# Patient Record
Sex: Female | Born: 1990 | Race: White | Hispanic: No | Marital: Married | Smoking: Former smoker
Health system: Southern US, Community
[De-identification: ages and names within clinical notes are randomized; demographics above are authoritative.]

## PROBLEM LIST (undated history)

## (undated) ENCOUNTER — Ambulatory Visit: Admission: EM | Payer: BC Managed Care – PPO | Source: Home / Self Care

## (undated) DIAGNOSIS — F419 Anxiety disorder, unspecified: Secondary | ICD-10-CM

---
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
## 2008-11-28 ENCOUNTER — Emergency Department (HOSPITAL_COMMUNITY): Admission: EM | Admit: 2008-11-28 | Discharge: 2008-11-28 | Payer: Self-pay | Admitting: Emergency Medicine

## 2009-12-03 ENCOUNTER — Emergency Department (HOSPITAL_COMMUNITY): Admission: EM | Admit: 2009-12-03 | Discharge: 2009-12-03 | Payer: Self-pay | Admitting: Emergency Medicine

## 2010-04-21 ENCOUNTER — Encounter: Payer: Self-pay | Admitting: Obstetrics & Gynecology

## 2010-08-13 NOTE — H&P (Signed)
Ellen Hodge, Ellen Hodge            ACCOUNT NO.:  [id number]   MEDICAL RECORD NO.:  [id number]          PATIENT TYPE:  INP   LOCATION:  9164                          FACILITY:  [facility]   PHYSICIAN:  Roseanna Rainbow, M.D.DATE OF BIRTH:  09/13/1990   DATE OF ADMISSION:  09/23/2007  DATE OF DISCHARGE:                              HISTORY & PHYSICAL   CHIEF COMPLAINT:  The patient is an 20 year old gravida 1, para 0 with  an estimated date of confinement of September 22, 2007, with an intrauterine  pregnancy at 40 plus weeks, complaining of rupture of membranes and  contractions.   HISTORY OF PRESENT ILLNESS:  Please see the above.   ALLERGIES:  No known drug allergies   MEDICATIONS:  Prenatal vitamins.   PRENATAL LABS:  Blood type is O+, antibody screen negative, Chlamydia  probe negative.  Urine culture and sensitivity, no growth.  GC probe  negative.  One-hour GCT 126.  Hepatitis B surface antigen negative.  Hematocrit 32.6 and hemoglobin 11.  HIV nonreactive.  Quad screen  negative.  Platelets 283,000.  RPR nonreactive.  Rubella immune.  GBS  negative on September 24, 2007.   PAST GYN HISTORY:  Noncontributory.   PAST MEDICAL HISTORY:  1. Tension headaches.  2. Asthma.   PAST SURGICAL HISTORY:  No previous surgery.   SOCIAL HISTORY:  She works at [employer].  She is single, living with a  significant other.  Does not give any significant history of alcohol  usage.  She has had no significant smoking history.  Denies illicit drug  use.   FAMILY HISTORY:  Alcoholism, Alzheimer's disease, arthritis, asthma,  melanoma, brain cancer, cervical cancer, breast cancer, head and neck  cancer, cataracts, adult-onset diabetes, heart disease, hypertension,  and hyperthyroidism.   PHYSICAL EXAMINATION:  On physical exam, vital signs are stable,  afebrile.  Fetal heart tracing reassuring.  Tocodynamometer, uterine  contractions every 7-8 minutes.  Sterile speculum exam per the RN and  nurse  practitioner, there is no pooling, only mucus.  Fern and Nitrazine  negative.  The cervix is 4 cm dilated, 90% effaced.   ASSESSMENT:  Primigravida at term, questionable spontaneous rupture of  membranes, late latent versus early active labor.  Fetal heart tracing  consistent with fetal well-being.   PLAN:  Admission, expectant management.      Roseanna Rainbow, M.D.  Electronically Signed     LAJ/MEDQ  D:  09/24/2007  T:  09/24/2007  Job:  [id number]

## 2010-12-26 LAB — CBC
HCT: 32.8 — ABNORMAL LOW
Hemoglobin: 11.2 — ABNORMAL LOW
MCHC: 34
MCHC: 34.4
MCV: 94.1
Platelets: 202
RBC: 2.17 — ABNORMAL LOW
RDW: 14.9

## 2012-09-27 ENCOUNTER — Emergency Department (HOSPITAL_COMMUNITY)
Admission: EM | Admit: 2012-09-27 | Discharge: 2012-09-27 | Discharge status: Against Medical Advice | Payer: Self-pay | Attending: Emergency Medicine | Admitting: Emergency Medicine

## 2012-09-27 ENCOUNTER — Emergency Department (HOSPITAL_COMMUNITY): Payer: Self-pay

## 2012-09-27 ENCOUNTER — Encounter (HOSPITAL_COMMUNITY): Payer: Self-pay | Admitting: Emergency Medicine

## 2012-09-27 DIAGNOSIS — F17200 Nicotine dependence, unspecified, uncomplicated: Secondary | ICD-10-CM | POA: Insufficient documentation

## 2012-09-27 DIAGNOSIS — R079 Chest pain, unspecified: Secondary | ICD-10-CM | POA: Insufficient documentation

## 2012-09-27 DIAGNOSIS — N63 Unspecified lump in unspecified breast: Secondary | ICD-10-CM | POA: Insufficient documentation

## 2012-09-27 NOTE — ED Notes (Signed)
Pt c/o chest tenderness to touch and ?mass in left breast area x 2 days

## 2012-09-27 NOTE — ED Notes (Signed)
Pt sts she can not wait longer and ambulated out of department

## 2012-09-28 ENCOUNTER — Emergency Department (HOSPITAL_BASED_OUTPATIENT_CLINIC_OR_DEPARTMENT_OTHER)
Admission: EM | Admit: 2012-09-28 | Discharge: 2012-09-28 | Disposition: A | Discharge status: Home / Self Care | Payer: Self-pay | Attending: Emergency Medicine | Admitting: Emergency Medicine

## 2012-09-28 ENCOUNTER — Encounter (HOSPITAL_BASED_OUTPATIENT_CLINIC_OR_DEPARTMENT_OTHER): Payer: Self-pay | Admitting: *Deleted

## 2012-09-28 DIAGNOSIS — R079 Chest pain, unspecified: Secondary | ICD-10-CM | POA: Insufficient documentation

## 2012-09-28 DIAGNOSIS — N63 Unspecified lump in unspecified breast: Secondary | ICD-10-CM | POA: Insufficient documentation

## 2012-09-28 DIAGNOSIS — F17200 Nicotine dependence, unspecified, uncomplicated: Secondary | ICD-10-CM | POA: Insufficient documentation

## 2012-09-28 DIAGNOSIS — N6320 Unspecified lump in the left breast, unspecified quadrant: Secondary | ICD-10-CM

## 2012-09-28 DIAGNOSIS — N644 Mastodynia: Secondary | ICD-10-CM | POA: Insufficient documentation

## 2012-09-28 NOTE — ED Provider Notes (Signed)
   History    CSN: [id number] Arrival date & time 09/28/12  1129  First MD Initiated Contact with Patient 09/28/12 1226     Chief Complaint  Patient presents with  . lump in left breast    (Consider location/radiation/quality/duration/timing/severity/associated sxs/prior Treatment) Patient is a 22 y.o. female presenting with chest pain. The history is provided by the patient. No language interpreter was used.  Chest Pain Chest pain location: left breast pain. Pain quality: aching   Pain radiates to the back: no   Pain severity:  Moderate Onset quality:  Gradual Timing:  Constant Relieved by:  Nothing Worsened by:  Nothing tried Anxiety: Pt reports area is sore to touch.   Pt complains of a lump in her left breast for the past 2 weeks.   History reviewed. No pertinent past medical history. History reviewed. No pertinent past surgical history. History reviewed. No pertinent family history. History  Substance Use Topics  . Smoking status: Current Every Day Smoker  . Smokeless tobacco: Not on file  . Alcohol Use: No   OB History   Grav Para Term Preterm Abortions TAB SAB Ect Mult Living                 Review of Systems  Cardiovascular: Positive for chest pain.  All other systems reviewed and are negative.    Allergies  Review of patient's allergies indicates no known allergies.  Home Medications  No current outpatient prescriptions on file. Pulse 72  Temp(Src) 98.4 F (36.9 C) (Oral)  Resp 18  Ht 5\' 5"  (1.651 m)  Wt 120 lb (54.432 kg)  BMI 19.97 kg/m2  SpO2 100%  LMP 08/29/2012 Physical Exam  Nursing note and vitals reviewed. Constitutional: She appears well-developed and well-nourished.  HENT:  Head: Normocephalic.  Cardiovascular: Normal rate.   Pulmonary/Chest: Effort normal.  Abdominal: Soft.  Musculoskeletal: Normal range of motion.  Tender left breast  1.5 cm round tender area,  12 oclock  Neurological: She is alert.  Skin: Skin is warm.    ED  Course  Procedures (including critical care time) Labs Reviewed - No data to display Dg Chest 2 View  09/27/2012   *RADIOLOGY REPORT*  Clinical Data: 22 year old female with intermittent dizziness. Left chest/breast pain.  CHEST - 2 VIEW  Comparison: None.  Findings: Normal lung volumes. Normal cardiac size and mediastinal contours.  Visualized tracheal air column is within normal limits. The lungs are clear.  No pneumothorax or effusion.  Mild scoliosis. No acute osseous abnormality identified.   Incidental bilateral anterior rib costochondral calcification.  IMPRESSION: Negative, no acute cardiopulmonary abnormality.   Original Report Authenticated By: Erskine Speed, M.D.   1. Breast mass, left     MDM  Pt scheduled at [facility] for a ultrasound of left breast  Elson Areas, PA-C 09/28/12 1311

## 2012-09-28 NOTE — ED Notes (Signed)
Breasts have been tender x 2-3 weeks several days ago noticed a lump in left breast

## 2012-09-29 ENCOUNTER — Other Ambulatory Visit: Payer: Self-pay | Admitting: Emergency Medicine

## 2012-09-29 ENCOUNTER — Ambulatory Visit
Admission: RE | Admit: 2012-09-29 | Discharge: 2012-09-29 | Disposition: A | Discharge status: Home / Self Care | Payer: No Typology Code available for payment source | Source: Ambulatory Visit | Attending: Emergency Medicine | Admitting: Emergency Medicine

## 2012-09-29 ENCOUNTER — Inpatient Hospital Stay: Admit: 2012-09-29 | Payer: No Typology Code available for payment source

## 2012-09-29 DIAGNOSIS — N63 Unspecified lump in unspecified breast: Secondary | ICD-10-CM

## 2012-09-29 NOTE — ED Provider Notes (Signed)
History/physical exam/procedure(s) were performed by non-physician practitioner and as supervising physician I was immediately available for consultation/collaboration. I have reviewed all notes and am in agreement with care and plan.   Hilario Quarry, MD 09/29/12 2123

## 2012-10-04 ENCOUNTER — Other Ambulatory Visit: Payer: No Typology Code available for payment source

## 2014-09-03 ENCOUNTER — Other Ambulatory Visit: Payer: Self-pay | Admitting: Pain Medicine

## 2015-10-16 ENCOUNTER — Encounter (HOSPITAL_COMMUNITY): Payer: Self-pay | Admitting: Emergency Medicine

## 2015-10-16 ENCOUNTER — Emergency Department (HOSPITAL_COMMUNITY)
Admission: EM | Admit: 2015-10-16 | Discharge: 2015-10-16 | Disposition: A | Discharge status: Home / Self Care | Payer: BLUE CROSS/BLUE SHIELD | Attending: Emergency Medicine | Admitting: Emergency Medicine

## 2015-10-16 ENCOUNTER — Emergency Department (HOSPITAL_COMMUNITY): Payer: BLUE CROSS/BLUE SHIELD

## 2015-10-16 DIAGNOSIS — Z7982 Long term (current) use of aspirin: Secondary | ICD-10-CM | POA: Diagnosis not present

## 2015-10-16 DIAGNOSIS — Z79899 Other long term (current) drug therapy: Secondary | ICD-10-CM | POA: Insufficient documentation

## 2015-10-16 DIAGNOSIS — R0789 Other chest pain: Secondary | ICD-10-CM | POA: Diagnosis present

## 2015-10-16 DIAGNOSIS — F17200 Nicotine dependence, unspecified, uncomplicated: Secondary | ICD-10-CM | POA: Insufficient documentation

## 2015-10-16 DIAGNOSIS — R0602 Shortness of breath: Secondary | ICD-10-CM | POA: Insufficient documentation

## 2015-10-16 HISTORY — DX: Anxiety disorder, unspecified: F41.9

## 2015-10-16 LAB — CBC WITH DIFFERENTIAL/PLATELET
BASOS ABS: 0 10*3/uL (ref 0.0–0.1)
BASOS PCT: 0 %
Eosinophils Absolute: 0.1 10*3/uL (ref 0.0–0.7)
Eosinophils Relative: 1 %
HEMATOCRIT: 36.3 % (ref 36.0–46.0)
Hemoglobin: 11.6 g/dL — ABNORMAL LOW (ref 12.0–15.0)
LYMPHS PCT: 15 %
Lymphs Abs: 1.6 10*3/uL (ref 0.7–4.0)
MCH: 27.9 pg (ref 26.0–34.0)
MCHC: 32 g/dL (ref 30.0–36.0)
MCV: 87.3 fL (ref 78.0–100.0)
MONO ABS: 0.9 10*3/uL (ref 0.1–1.0)
Monocytes Relative: 8 %
NEUTROS ABS: 8.1 10*3/uL — AB (ref 1.7–7.7)
Neutrophils Relative %: 76 %
PLATELETS: 401 10*3/uL — AB (ref 150–400)
RBC: 4.16 MIL/uL (ref 3.87–5.11)
RDW: 13.8 % (ref 11.5–15.5)
WBC: 10.7 10*3/uL — AB (ref 4.0–10.5)

## 2015-10-16 LAB — BASIC METABOLIC PANEL
ANION GAP: 6 (ref 5–15)
BUN: 10 mg/dL (ref 6–20)
CALCIUM: 9.6 mg/dL (ref 8.9–10.3)
CO2: 27 mmol/L (ref 22–32)
Chloride: 106 mmol/L (ref 101–111)
Creatinine, Ser: 0.8 mg/dL (ref 0.44–1.00)
GLUCOSE: 95 mg/dL (ref 65–99)
POTASSIUM: 3.7 mmol/L (ref 3.5–5.1)
Sodium: 139 mmol/L (ref 135–145)

## 2015-10-16 LAB — I-STAT TROPONIN, ED: Troponin i, poc: 0 ng/mL (ref 0.00–0.08)

## 2015-10-16 LAB — I-STAT BETA HCG BLOOD, ED (MC, WL, AP ONLY)

## 2015-10-16 LAB — BRAIN NATRIURETIC PEPTIDE: B NATRIURETIC PEPTIDE 5: 22.8 pg/mL (ref 0.0–100.0)

## 2015-10-16 MED ORDER — LORAZEPAM 1 MG PO TABS
1.0000 mg | ORAL_TABLET | Freq: Once | ORAL | Status: AC
  Administered 2015-10-16: 1 mg via ORAL
  Filled 2015-10-16: qty 1

## 2015-10-16 NOTE — ED Provider Notes (Signed)
CSN: [id number]     Arrival date & time 10/16/15  1524 History   First MD Initiated Contact with Patient 10/16/15 1631     Chief Complaint  Patient presents with  . Chest Pain  . Anxiety     (Consider location/radiation/quality/duration/timing/severity/associated sxs/prior Treatment) HPI [age] year old female who presents with intermittent chest pressure and tightness for past week or so. History of Anxiety. States intermittent chest pressure and tightness, and has had 2 episodes that woken her up from sleep. Not associated with exertion. Unable to predict onset and not sure what aggravates it, incites, or alleviates it. Has not taken any medications. When she has symptoms, associated with dyspnea. States symptoms have been increasing her stress. She has had increased stress at home. No fever, chills, cough or other URI symptoms. No LE edema or calf tenderness. No immobilizations, family history of DVT/PE or personal history. No birth control. No orthopnea or PND.   Today at the gas station, when she started feeling tingling all over, lightheaded with chest pressure. No syncope, but became very anxious about it. States that her husbands side of family with early heart problems that made her very concerned.  Past Medical History  Diagnosis Date  . Anxiety    History reviewed. No pertinent past surgical history. History reviewed. No pertinent family history. Social History  Substance Use Topics  . Smoking status: Current Every Day Smoker  . Smokeless tobacco: None  . Alcohol Use: No   OB History    No data available     Review of Systems 10/14 systems reviewed and are negative other than those stated in the HPI    Allergies  Review of patient's allergies indicates no known allergies.  Home Medications   Prior to Admission medications   Medication Sig Start Date End Date Taking? Authorizing Provider  aspirin EC 325 MG tablet Take 650 mg by mouth 2 (two) times daily as needed for  mild pain.   Yes Historical Provider, MD  Multiple Vitamins-Minerals (HAIR SKIN AND NAILS FORMULA PO) Take 2 tablets by mouth daily.   Yes Historical Provider, MD   BP 119/76 mmHg  Pulse 83  Temp(Src) 97.7 F (36.5 C) (Oral)  Resp 14  SpO2 100%  LMP 10/02/2015 (Approximate) Physical Exam Physical Exam  Nursing note and vitals reviewed. Constitutional: Well developed, well nourished, anxious appearing and intermittently tearful, but non-toxic, and in no acute distress Head: Normocephalic and atraumatic.  Mouth/Throat: Oropharynx is clear and moist.  Neck: Normal range of motion. Neck supple.  Cardiovascular: Normal rate and regular rhythm.   Pulmonary/Chest: Effort normal and breath sounds normal.  Abdominal: Soft. There is no tenderness. There is no rebound and no guarding.  Musculoskeletal: Normal range of motion.  Neurological: Alert, no facial droop, fluent speech, moves all extremities symmetrically Skin: Skin is warm and dry.  Psychiatric: Cooperative  ED Course  Procedures (including critical care time) Labs Review Labs Reviewed  CBC WITH DIFFERENTIAL/PLATELET - Abnormal; Notable for the following:    WBC 10.7 (*)    Hemoglobin 11.6 (*)    Platelets 401 (*)    Neutro Abs 8.1 (*)    All other components within normal limits  BASIC METABOLIC PANEL  BRAIN NATRIURETIC PEPTIDE  I-STAT TROPOININ, ED  I-STAT BETA HCG BLOOD, ED ([facility], [facility], [facility] ONLY)    Imaging Review Dg Chest 2 View  10/16/2015  CLINICAL DATA:  Shortness of breath and right-sided chest pressure sensation EXAM: CHEST  2 VIEW COMPARISON:  September 27, 2012 FINDINGS: Lungs are clear. Heart size and pulmonary vascularity are normal. No adenopathy. There is lower thoracic levoscoliosis. No pneumothorax. IMPRESSION: No edema or consolidation. Electronically Signed   By: Bretta Bang III M.D.   On: 10/16/2015 17:49   I have personally reviewed and evaluated these images and lab results as part of my medical  decision-making.   EKG Interpretation   Date/Time:  Tuesday October 16 2015 15:36:09 EDT Ventricular Rate:  92 PR Interval:    QRS Duration: 91 QT Interval:  344 QTC Calculation: 426 R Axis:   83 Text Interpretation:  Sinus rhythm Consider left atrial enlargement RSR'  in V1 or V2, probably normal variant Nonspecific repol abnormality,  diffuse leads No acute changes Confirmed by Amadeo Coke MD, Annabelle Harman ([id number]) on  10/16/2015 4:33:19 PM      MDM   Final diagnoses:  Chest pressure   25 year old female who presents with intermittent chest pressure, dyspnea, and lightheadedness. Well appearing but very anxious on presentation. Vital signs within normal limits. EKG unchanged from prior and overall non-concerning. CXR normal. BNP normal. PERC negative. Not suspcious for ACS given lack of risk factors and pain atypical. PERC negative and no risk factors for PE. Not pregnant. Low suspicious for life threatening intrathoracic or cardiopulmonary processes. Discussed supportive management, and establishing PCP for ongoing care. Strict return and follow-up instructions reviewed. She expressed understanding of all discharge instructions and felt comfortable with the plan of care.      Lavera Guise, MD 10/16/15 [phone]

## 2015-10-16 NOTE — Discharge Instructions (Signed)
We did not find serious cause of your chest pain today. Return without fail for worsening symptoms, including passing out, worsening pain or difficulty breathing, or any other symptoms concerning to you.  Nonspecific Chest Pain It is often hard to find the cause of chest pain. There is always a chance that your pain could be related to something serious, such as a heart attack or a blood clot in your lungs. Chest pain can also be caused by conditions that are not life-threatening. If you have chest pain, it is very important to follow up with your doctor.  HOME CARE  If you were prescribed an antibiotic medicine, finish it all even if you start to feel better.  Avoid any activities that cause chest pain.  Do not use any tobacco products, including cigarettes, chewing tobacco, or electronic cigarettes. If you need help quitting, ask your doctor.  Do not drink alcohol.  Take medicines only as told by your doctor.  Keep all follow-up visits as told by your doctor. This is important. This includes any further testing if your chest pain does not go away.  Your doctor may tell you to keep your head raised (elevated) while you sleep.  Make lifestyle changes as told by your doctor. These may include:  Getting regular exercise. Ask your doctor to suggest some activities that are safe for you.  Eating a heart-healthy diet. Your doctor or a diet specialist (dietitian) can help you to learn healthy eating options.  Maintaining a healthy weight.  Managing diabetes, if necessary.  Reducing stress. GET HELP IF:  Your chest pain does not go away, even after treatment.  You have a rash with blisters on your chest.  You have a fever. GET HELP RIGHT AWAY IF:  Your chest pain is worse.  You have an increasing cough, or you cough up blood.  You have severe belly (abdominal) pain.  You feel extremely weak.  You pass out (faint).  You have chills.  You have sudden, unexplained chest  discomfort.  You have sudden, unexplained discomfort in your arms, back, neck, or jaw.  You have shortness of breath at any time.  You suddenly start to sweat, or your skin gets clammy.  You feel nauseous.  You vomit.  You suddenly feel light-headed or dizzy.  Your heart begins to beat quickly, or it feels like it is skipping beats. These symptoms may be an emergency. Do not wait to see if the symptoms will go away. Get medical help right away. Call your local emergency services (911 in the [place] Do not drive yourself to the hospital.   This information is not intended to replace advice given to you by your health care provider. Make sure you discuss any questions you have with your health care provider.   Document Released: 09/03/2007 Document Revised: 04/07/2014 Document Reviewed: 10/21/2013 Elsevier Interactive Patient Education [employer]2016 Elsevier Inc.

## 2015-10-16 NOTE — ED Notes (Addendum)
Pt reports mid chest pressure/tightness for the past week. Today symptoms got worse and were accompanied by feeling hot and extremity tingling. Hx of anxiety, but does not take anything for it.

## 2017-02-11 ENCOUNTER — Ambulatory Visit: Payer: BLUE CROSS/BLUE SHIELD | Admitting: Obstetrics and Gynecology

## 2017-03-31 HISTORY — PX: BREAST ENHANCEMENT SURGERY: SHX7

## 2018-12-02 ENCOUNTER — Other Ambulatory Visit: Payer: Self-pay

## 2018-12-02 ENCOUNTER — Ambulatory Visit
Admission: EM | Admit: 2018-12-02 | Discharge: 2018-12-02 | Disposition: A | Discharge status: Home / Self Care | Payer: BC Managed Care – PPO | Attending: Emergency Medicine | Admitting: Emergency Medicine

## 2018-12-02 ENCOUNTER — Encounter: Payer: Self-pay | Admitting: Emergency Medicine

## 2018-12-02 DIAGNOSIS — L03115 Cellulitis of right lower limb: Secondary | ICD-10-CM | POA: Diagnosis not present

## 2018-12-02 DIAGNOSIS — W57XXXA Bitten or stung by nonvenomous insect and other nonvenomous arthropods, initial encounter: Secondary | ICD-10-CM

## 2018-12-02 MED ORDER — DOXYCYCLINE HYCLATE 100 MG PO CAPS: 100.0000 mg | ORAL_CAPSULE | Freq: Two times a day (BID) | ORAL | 0 refills | Status: AC

## 2018-12-02 NOTE — ED Provider Notes (Signed)
[facility]    CSN: [id number] Arrival date [id number]& time: 12/02/18  [id number]      History   Chief Complaint Chief Complaint  Patient presents with  . Cellulitis    HPI Camille A Ave FilterChandler is a 28 y.o. female presenting for rash on her right inferolateral glute.  States that she was bitten by a bug/possibly spider 2 days ago: Was unable to see/identify bug.  Patient states that she marked circle around the raised area of erythema/warmth this morning: No significant growth since then.  Patient denies arthralgias, myalgias, fever, malaise.  Has not used anything for her symptoms.   Past Medical History:  Diagnosis Date  . Anxiety     There are no active problems to display for this patient.   History reviewed. No pertinent surgical history.  OB History   No obstetric history on file.      Home Medications    Prior to Admission medications   Medication Sig Start Date End Date Taking? Authorizing Provider  aspirin EC 325 MG tablet Take 650 mg by mouth 2 (two) times daily as needed for mild pain.    [provider]  doxycycline (VIBRAMYCIN) 100 MG capsule Take 1 capsule (100 mg total) by mouth 2 (two) times daily for 7 days. 12/02/18 12/09/18  Hall-Potvin, [place], PA-C  Multiple Vitamins-Minerals (HAIR SKIN AND NAILS FORMULA PO) Take 2 tablets by mouth daily.    [provider]    Family History History reviewed. No pertinent family history.  Social History Social History   Tobacco Use  . Smoking status: Former Smoker    Quit date: 2015    Years since quitting: 5.6  . Smokeless tobacco: Never Used  Substance Use Topics  . Alcohol use: No  . Drug use: No     Allergies   Patient has no known allergies.   Review of Systems Review of Systems  Constitutional: Negative for fatigue and fever.  HENT: Negative for ear pain, sinus pain, sore throat and voice change.   Eyes: Negative for pain, redness and visual disturbance.  Respiratory:  Negative for cough and shortness of breath.   Cardiovascular: Negative for chest pain and palpitations.  Gastrointestinal: Negative for abdominal pain, diarrhea and vomiting.  Musculoskeletal: Negative for arthralgias and myalgias.  Skin: Positive for rash. Negative for wound.  Neurological: Negative for syncope and headaches.     Physical Exam Triage Vital Signs ED Triage Vitals  Enc Vitals Group     BP      Pulse      Resp      Temp      Temp src      SpO2      Weight      Height      Head Circumference      Peak Flow      Pain Score      Pain Loc      Pain Edu?      Excl. in GC?    No data found.  Updated Vital Signs BP 123/80 (BP Location: Right Arm)   Pulse 81   Temp 97.8 F (36.6 C) (Oral)   Resp 18   LMP 11/24/2018   SpO2 98%    Physical Exam Constitutional:      General: She is not in acute distress. HENT:     Head: Normocephalic and atraumatic.  Eyes:     General: No scleral icterus.    Pupils: Pupils are equal, round,  and reactive to light.  Cardiovascular:     Rate and Rhythm: Normal rate.  Pulmonary:     Effort: Pulmonary effort is normal.  Skin:    Coloration: Skin is not jaundiced or pale.     Comments: 8 cm circumferential raised area of erythema that is hot and tender to patient.  No streaking, discharge.  Lesion appears to be indurated without fluctuance.  Neurological:     Mental Status: She is alert and oriented to person, place, and time.      UC Treatments / Results  Labs (all labs ordered are listed, but only abnormal results are displayed) Labs Reviewed - No data to display  EKG   Radiology No results found.  Procedures Procedures (including critical care time)  Medications Ordered in UC Medications - No data to display  Initial Impression / Assessment and Plan / UC Course  I have reviewed the triage vital signs and the nursing notes.  Pertinent labs & imaging results that were available during my care of the  patient were reviewed by me and considered in my medical decision making (see chart for details).     1.  Bug bite Unknown species.  Patient will continue to monitor for systemic symptoms including fever, arthralgia, myalgia, chest pain, shortness of breath, streaking.  2.  Cellulitis Area is tender, indurated, hot to touch.  Will initiate doxycycline as lesion is highly suspicious for infection.  Return precautions discussed, patient verbalized understanding and is agreeable to plan. Final Clinical Impressions(s) / UC Diagnoses   Final diagnoses:  Cellulitis of right lower extremity  Bug bite, initial encounter     Discharge Instructions     Take antibiotic as prescribed. Return for worsening redness, pain, discharge, fever.    ED Prescriptions    Medication Sig Dispense Auth. Provider   doxycycline (VIBRAMYCIN) 100 MG capsule Take 1 capsule (100 mg total) by mouth 2 (two) times daily for 7 days. 14 capsule Hall-Potvin, [place], PA-C     Controlled Substance Prescriptions [place] Controlled Substance Registry consulted? Not Applicable   Quincy Sheehan, [place] 12/02/18 9518

## 2018-12-02 NOTE — Discharge Instructions (Addendum)
Take antibiotic as prescribed. Return for worsening redness, pain, discharge, fever.

## 2018-12-02 NOTE — ED Notes (Signed)
Patient able to ambulate independently  

## 2018-12-02 NOTE — ED Triage Notes (Signed)
Pt presents to [facility] for assessment of possible bug bite to right posterior thigh with redness and cellulitis spreading from area.  Patient marked around the redness this morning and redness has spread slightly outside her marks at this time.

## 2018-12-03 ENCOUNTER — Telehealth: Payer: Self-pay | Admitting: Emergency Medicine

## 2018-12-03 NOTE — Telephone Encounter (Signed)
Checked in on patient, allowed her to ask any questions she wanted, and encouraged return call if she has any issues.

## 2019-10-19 ENCOUNTER — Ambulatory Visit
Admission: EM | Admit: 2019-10-19 | Discharge: 2019-10-19 | Disposition: A | Discharge status: Home / Self Care | Payer: BC Managed Care – PPO | Attending: Family Medicine | Admitting: Family Medicine

## 2019-10-19 ENCOUNTER — Ambulatory Visit (INDEPENDENT_AMBULATORY_CARE_PROVIDER_SITE_OTHER): Payer: BC Managed Care – PPO

## 2019-10-19 DIAGNOSIS — M25472 Effusion, left ankle: Secondary | ICD-10-CM

## 2019-10-19 DIAGNOSIS — S93402A Sprain of unspecified ligament of left ankle, initial encounter: Secondary | ICD-10-CM | POA: Diagnosis not present

## 2019-10-19 DIAGNOSIS — W1830XA Fall on same level, unspecified, initial encounter: Secondary | ICD-10-CM

## 2019-10-19 DIAGNOSIS — M25572 Pain in left ankle and joints of left foot: Secondary | ICD-10-CM | POA: Diagnosis not present

## 2019-10-19 MED ORDER — TRAMADOL HCL 50 MG PO TABS: 50.0000 mg | ORAL_TABLET | Freq: Four times a day (QID) | ORAL | 0 refills | Status: DC | PRN

## 2019-10-19 MED ORDER — CYCLOBENZAPRINE HCL 10 MG PO TABS: 10.0000 mg | ORAL_TABLET | Freq: Two times a day (BID) | ORAL | 0 refills | Status: DC | PRN

## 2019-10-19 NOTE — ED Triage Notes (Signed)
Pt states stepped in a hole in her yard this am, rolled lt ankle and fell to the ground. States iced and elevated this am. Swelling and bruising noted to lt ankle.

## 2019-10-19 NOTE — Discharge Instructions (Addendum)
Take the tramadol as prescribed.  Rest and elevate your hand.  Apply ice packs 2-3 times a day for up to 20 minutes each.   I have sent in cyclobenzaprine for you to take twice a day as needed for muscle spasm  Wear the wrap when you are up and active. Wear boot with the wrap after about 2-3 days of staying off of the ankle  If you are not improving over the next 2 weeks, follow up with orthopedics    Follow up with your primary care provider or an orthopedist if you symptoms continue or worsen;  Or if you develop new symptoms, such as numbness, tingling, or weakness.

## 2019-10-19 NOTE — ED Provider Notes (Signed)
[facility]   [id number] 10/19/19 Arrival Time: 1442  TD:DUKGU PAIN  SUBJECTIVE: History from: patient   [employer]   [id number] 10/19/19 Arrival Time: 1442  SE:GBTDV PAIN  SUBJECTIVE: History from: patient. Ellen Hodge is a 29 y.o. female complains of left ankle pain that began about 10 AM this morning.  Reports that she got outside in a hole on the ground that she did not see.  Reports that her ankle bilaterally.  Reports that he has a very swollen, very painful.  Reports she cannot bear weight on it.  Has taken ibuprofen with no relief.  Has used ice with some relief.  Is walking with crutches.  Localizes the pain and swelling to lateral left ankle.  Symptoms are made worse with activity, mildly alleviated by rest. Denies fever, chills, erythema, ecchymosis, weakness, numbness and tingling, saddle paresthesias, loss of bowel or bladder function.      ROS: As per HPI.  All other pertinent ROS negative.     Past Medical History:  Diagnosis Date  . Anxiety    History reviewed. No pertinent surgical history. No Known Allergies No current facility-administered medications on file prior to encounter.   Current Outpatient Medications on File Prior to Encounter  Medication Sig Dispense Refill  . aspirin EC 325 MG tablet Take 650 mg by mouth 2 (two) times daily as needed for mild pain.    . Multiple Vitamins-Minerals (HAIR SKIN AND NAILS FORMULA PO) Take 2 tablets by mouth daily.     Social History   Socioeconomic History  . Marital status: Single    Spouse name: Not on file  . Number of children: Not on file  . Years of education: Not on file  . Highest education level: Not on file  Occupational History  . Not on file  Tobacco Use  . Smoking status: Former Smoker    Quit date: 2015    Years since quitting: 6.5  . Smokeless tobacco: Never Used  Substance and Sexual Activity  . Alcohol use: No  . Drug use: No  . Sexual activity: Not on file    Other Topics Concern  . Not on file  Social History Narrative  . Not on file   Social Determinants of Health   Financial Resource Strain:   . Difficulty of Paying Living Expenses:   Food Insecurity:   . Worried About Programme researcher, broadcasting/film/video in the Last Year:   . Barista in the Last Year:   Transportation Needs:   . Freight forwarder (Medical):   Marland Kitchen Lack of Transportation (Non-Medical):   Physical Activity:   . Days of Exercise per Week:   . Minutes of Exercise per Session:   Stress:   . Feeling of Stress :   Social Connections:   . Frequency of Communication with Friends and Family:   . Frequency of Social Gatherings with Friends and Family:   . Attends Religious Services:   . Active Member of Clubs or Organizations:   . Attends Banker Meetings:   Marland Kitchen Marital Status:   Intimate Partner Violence:   . Fear of Current or Ex-Partner:   . Emotionally Abused:   Marland Kitchen Physically Abused:   . Sexually Abused:    No family history on file.  OBJECTIVE:  Vitals:   10/19/19 1539 10/19/19 1542  BP: 135/78   Pulse: 70   Resp:  18  Temp: 98.1 F (36.7 C)   TempSrc: Oral  SpO2: 98%     General appearance: ALERT; in no acute distress.  Head: NCAT Lungs: Normal respiratory effort CV: pedal pulses 2+ bilaterally. Cap refill < 2 seconds Musculoskeletal:  Inspection: Skin warm, dry, clear and intact without obvious erythema, ecchymosis.  Obvious effusion to left lateral ankle Palpation: Left lateral ankle very tender to palpation ROM: Limited ROM active and passive to left ankle Skin: warm and dry Neurologic: Sensation intact about the upper/ lower extremities Psychological: alert and cooperative; normal mood and affect  DIAGNOSTIC STUDIES:  DG Ankle Complete Left  Result Date: 10/19/2019 CLINICAL DATA:  Larey Seat.  Lateral pain and swelling. EXAM: LEFT ANKLE COMPLETE - 3+ VIEW COMPARISON:  None. FINDINGS: Joint effusion and lateral soft tissue swelling. No  evidence of acute fracture. Corticated ossicle or old avulsion injury inferior to the tip of the fibula. IMPRESSION: Lateral soft tissue swelling and joint effusion. No acute fracture or dislocation. Electronically Signed   By: Paulina Fusi M.D.   On: 10/19/2019 16:10     ASSESSMENT & PLAN:  1. Pain and swelling of left ankle   2. Sprain of left ankle, unspecified ligament, initial encounter    Meds ordered this encounter  Medications  . traMADol (ULTRAM) 50 MG tablet    Sig: Take 1 tablet (50 mg total) by mouth every 6 (six) hours as needed.    Dispense:  15 tablet    Refill:  0    Order Specific Question:   Supervising Provider    Answer:   Merrilee Jansky [id number]  . cyclobenzaprine (FLEXERIL) 10 MG tablet    Sig: Take 1 tablet (10 mg total) by mouth 2 (two) times daily as needed for muscle spasms.    Dispense:  20 tablet    Refill:  0    Order Specific Question:   Supervising Provider    Answer:   Merrilee Jansky [id number]   X-ray negative with the exception of soft tissue swelling. Continue conservative management of rest, ice, and gentle stretches Take ibuprofen as needed for pain relief (may cause abdominal discomfort, ulcers, and GI bleeds avoid taking with other NSAIDs) Take cyclobenzaprine at nighttime for symptomatic relief. Avoid driving or operating heavy machinery while using medication. Take tramadol as prescribed and as needed for breakthrough pain Wear an Ace wrap for the next day or 2 to help compress the area, continue to use the crutches for the next 2 to 3 days Then moved to the ASO wrap in the boot, boot not given in office this patient reports that she has 1 at home from a previous injury Follow up with orthopedics if symptoms persist Return or go to the ER if you have any new or worsening symptoms (fever, chills, chest pain, abdominal pain, changes in bowel or bladder habits, pain radiating into lower legs)   [place] Controlled Substances Registry consulted  for this patient. I feel the risk/benefit ratio today is favorable for proceeding with this prescription for a controlled substance. Medication sedation precautions given.  Reviewed expectations re: course of current medical issues. Questions answered. Outlined signs and symptoms indicating need for more acute intervention. Patient verbalized understanding. After Visit Summary given.     Moshe Cipro, NP 10/20/19 [phone]

## 2020-03-31 NOTE — L&D Delivery Note (Signed)
Delivery Note I was called at 1812pm and notified that pt had SROM. About a minute later pt was noted to be completely dilated with fetal parts coming through the os.  On arrival, fetal body noted to have delivered with head undelivered.  On exam I noted fetal head in vagina. Using smellyvite maneuver, with posterior vaginal pressure, head delivered with one push:  At 6:33 PM a non-viable female was delivered via Vaginal, Breech (Presentation:  frank breech    ).  APGAR:0 ,0 ; weight pending  Cord was clamped and FOB allowed to cut it.  Pt requested baby be placed on her abdomen.   Placenta was delivered with next two pushes, intact, duncan.  Cord: 3 vessels with the following complications: None.  Cord pH: n/a  Anesthesia: Epidural Episiotomy: None Lacerations: None Suture Repair:  n/a Est. Blood Loss (mL):    Mom to postpartum.  Baby to Couplet care / Skin to Skin Pt desires to hold baby for now Unsure if wants anora testing - explained procedure No obvious fetal or placental pathology noted .  Cathrine Muster 01/09/2021, 7:04 PM

## 2020-08-28 LAB — OB RESULTS CONSOLE GC/CHLAMYDIA
Chlamydia: NEGATIVE
Gonorrhea: NEGATIVE

## 2020-09-25 LAB — OB RESULTS CONSOLE RPR: RPR: NONREACTIVE

## 2020-09-25 LAB — OB RESULTS CONSOLE HIV ANTIBODY (ROUTINE TESTING): HIV: NONREACTIVE

## 2020-09-26 LAB — OB RESULTS CONSOLE RUBELLA ANTIBODY, IGM: Rubella: IMMUNE

## 2021-01-02 IMAGING — DX DG ANKLE COMPLETE 3+V*L*
3 series · 3 of 3 positions shown · non-contrast
Comparison: None.

CLINICAL DATA: Fell.  Lateral pain and swelling.

EXAM:
LEFT ANKLE COMPLETE - 3+ VIEW

[ankle ap]
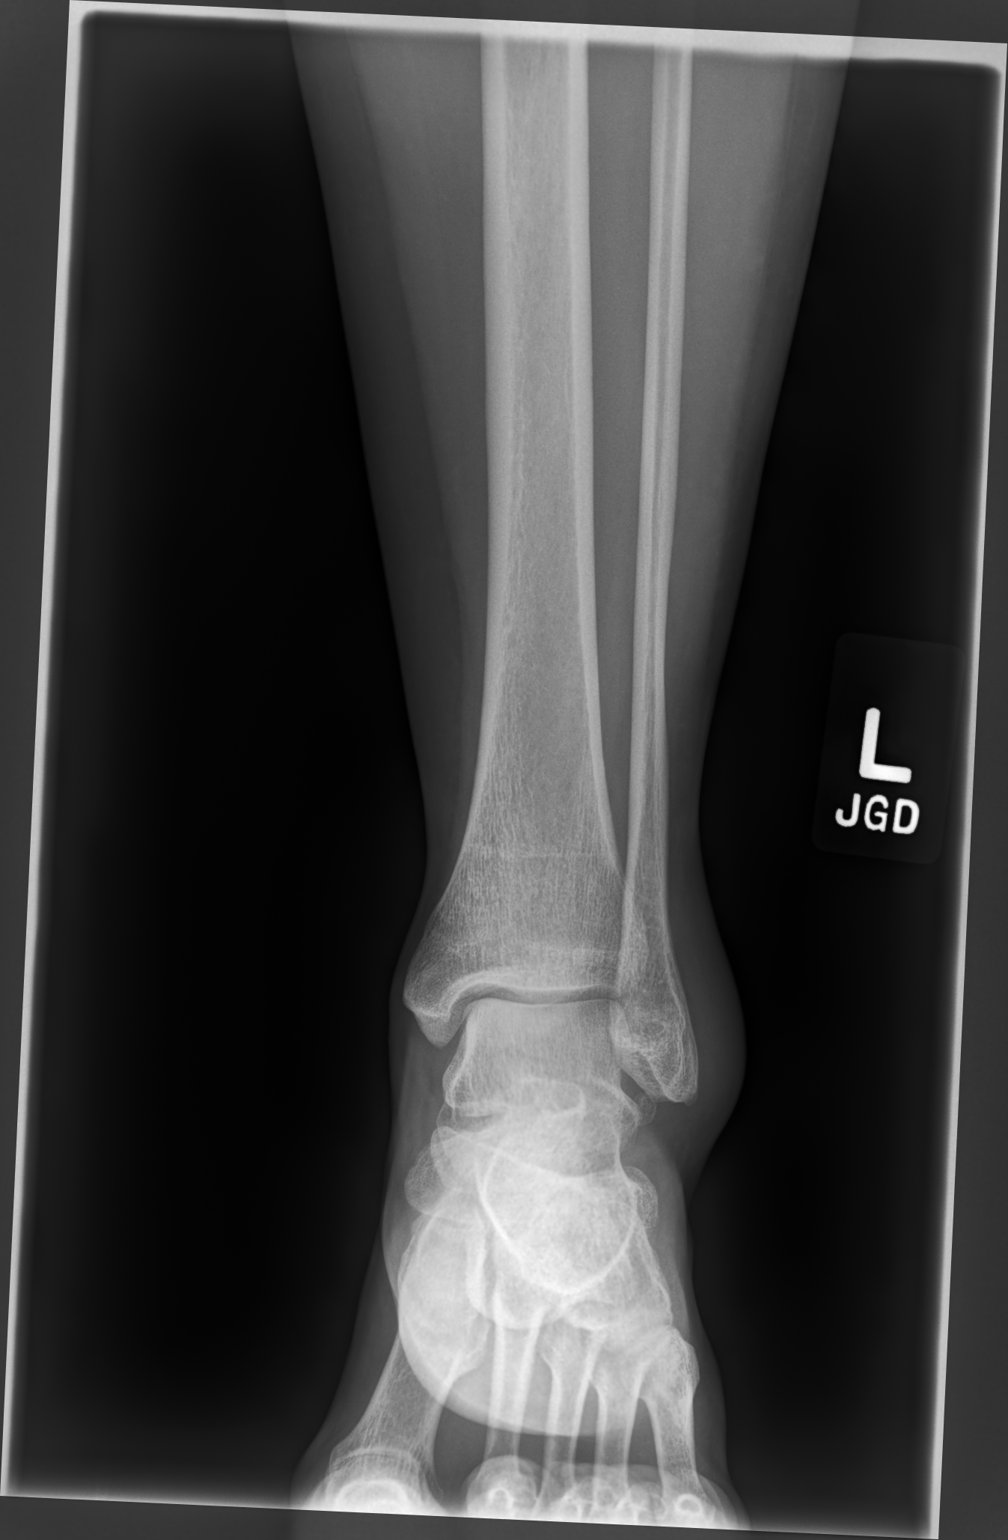

[ankle oblique]
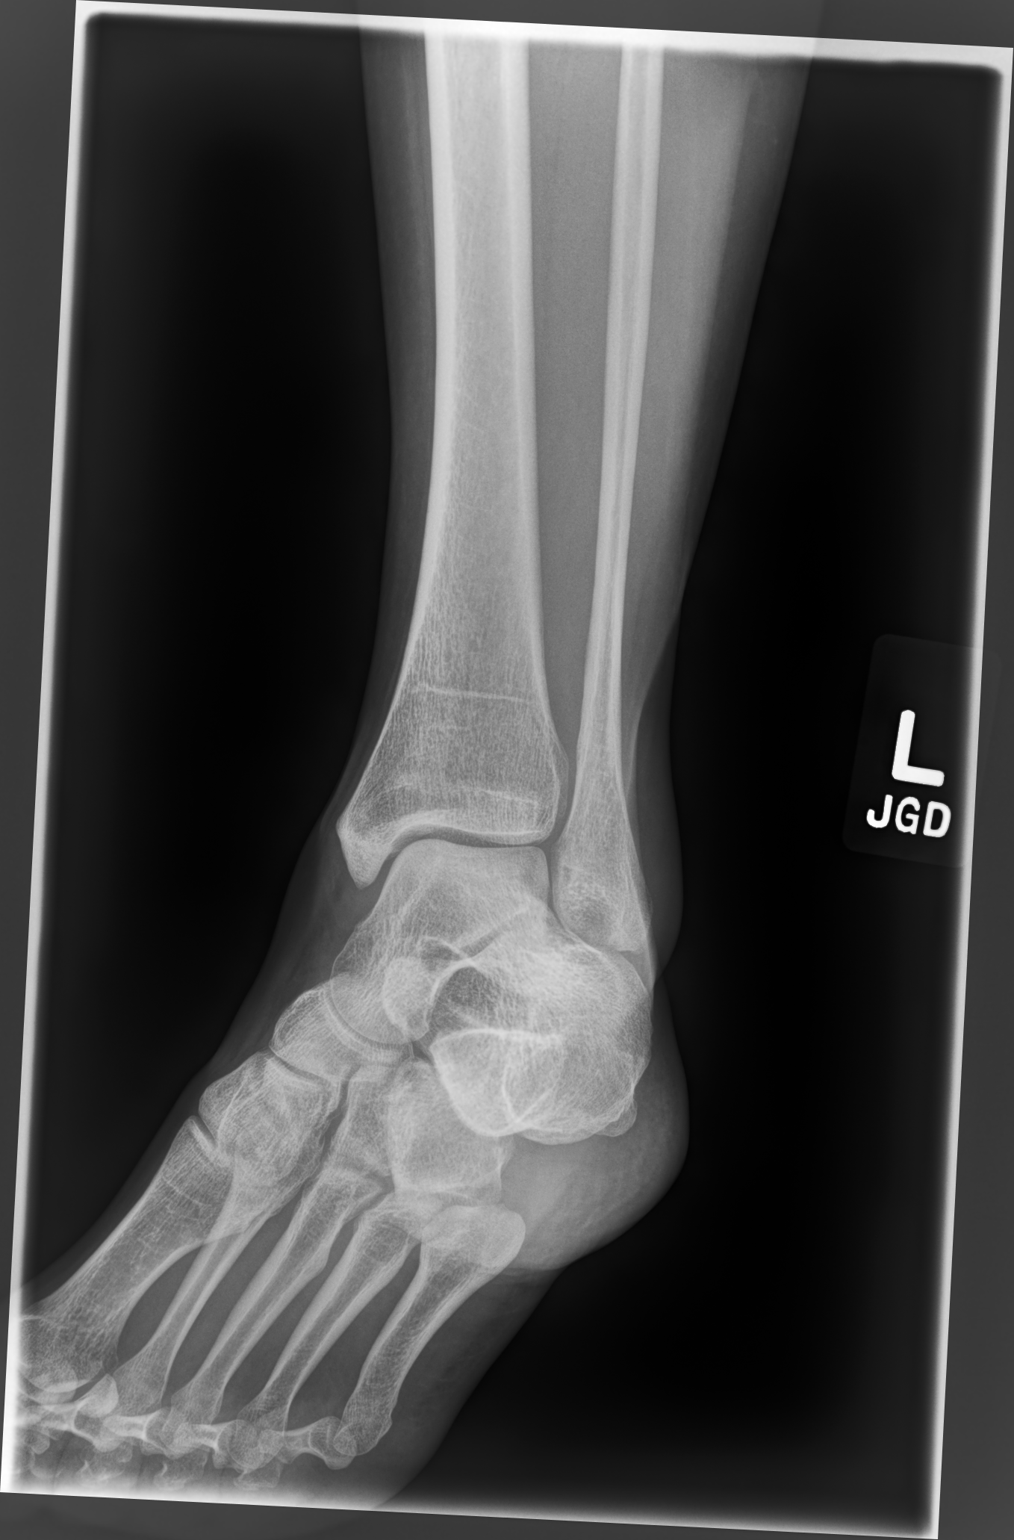

[ankle lat]
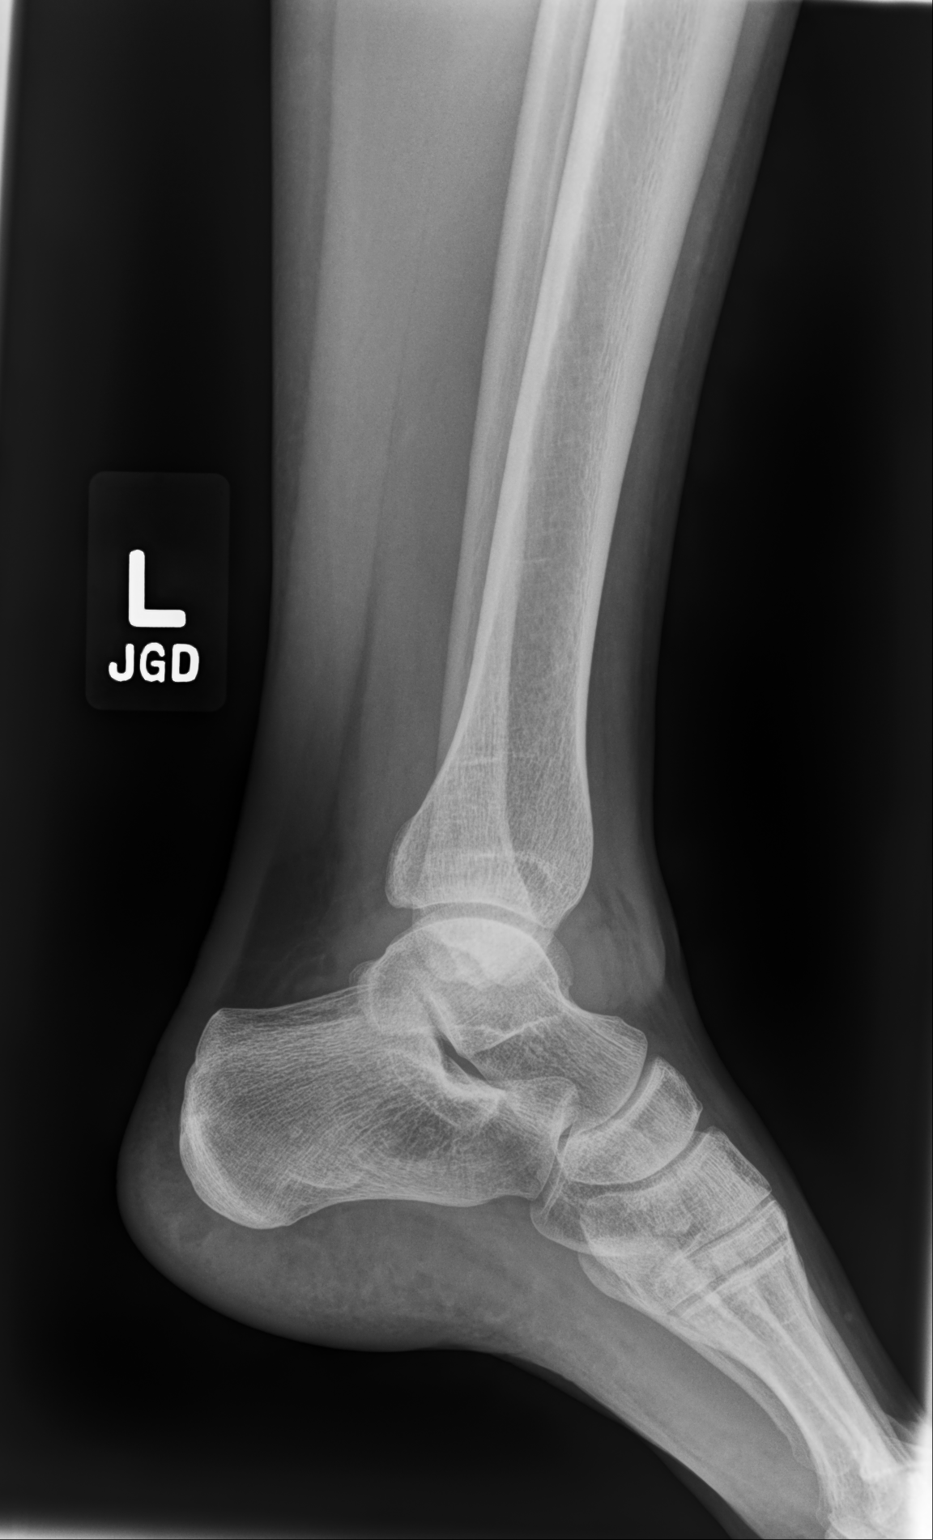

[3 of 3 positions shown; findings below may reference images not displayed]

FINDINGS: Joint effusion and lateral soft tissue swelling. No evidence of
acute fracture. Corticated ossicle or old avulsion injury inferior
to the tip of the fibula.
IMPRESSION: Lateral soft tissue swelling and joint effusion. No acute fracture
or dislocation.

## 2021-01-07 ENCOUNTER — Other Ambulatory Visit: Payer: Self-pay

## 2021-01-07 ENCOUNTER — Inpatient Hospital Stay (HOSPITAL_COMMUNITY)
Admission: AD | Admit: 2021-01-07 | Discharge: 2021-01-11 | DRG: 806 | Disposition: A | Discharge status: Home / Self Care | Payer: BC Managed Care – PPO | Attending: Obstetrics and Gynecology | Admitting: Obstetrics and Gynecology

## 2021-01-07 ENCOUNTER — Encounter (HOSPITAL_COMMUNITY): Payer: Self-pay | Admitting: *Deleted

## 2021-01-07 DIAGNOSIS — D62 Acute posthemorrhagic anemia: Secondary | ICD-10-CM | POA: Diagnosis not present

## 2021-01-07 DIAGNOSIS — O99344 Other mental disorders complicating childbirth: Secondary | ICD-10-CM | POA: Diagnosis present

## 2021-01-07 DIAGNOSIS — Z87891 Personal history of nicotine dependence: Secondary | ICD-10-CM

## 2021-01-07 DIAGNOSIS — Z3A26 26 weeks gestation of pregnancy: Secondary | ICD-10-CM | POA: Diagnosis not present

## 2021-01-07 DIAGNOSIS — O9081 Anemia of the puerperium: Secondary | ICD-10-CM | POA: Diagnosis not present

## 2021-01-07 DIAGNOSIS — O321XX0 Maternal care for breech presentation, not applicable or unspecified: Secondary | ICD-10-CM | POA: Diagnosis present

## 2021-01-07 DIAGNOSIS — F419 Anxiety disorder, unspecified: Secondary | ICD-10-CM | POA: Diagnosis present

## 2021-01-07 DIAGNOSIS — O364XX0 Maternal care for intrauterine death, not applicable or unspecified: Secondary | ICD-10-CM | POA: Diagnosis present

## 2021-01-07 DIAGNOSIS — Z20822 Contact with and (suspected) exposure to covid-19: Secondary | ICD-10-CM | POA: Diagnosis present

## 2021-01-07 LAB — TYPE AND SCREEN
ABO/RH(D): O POS
Antibody Screen: NEGATIVE

## 2021-01-07 LAB — RESP PANEL BY RT-PCR (FLU A&B, COVID) ARPGX2
Influenza A by PCR: NEGATIVE
Influenza B by PCR: NEGATIVE
SARS Coronavirus 2 by RT PCR: NEGATIVE

## 2021-01-07 LAB — CBC
HCT: 35.8 % — ABNORMAL LOW (ref 36.0–46.0)
Hemoglobin: 11.9 g/dL — ABNORMAL LOW (ref 12.0–15.0)
MCH: 30.8 pg (ref 26.0–34.0)
MCHC: 33.2 g/dL (ref 30.0–36.0)
MCV: 92.7 fL (ref 80.0–100.0)
Platelets: 340 10*3/uL (ref 150–400)
RBC: 3.86 MIL/uL — ABNORMAL LOW (ref 3.87–5.11)
RDW: 12.8 % (ref 11.5–15.5)
WBC: 14.6 10*3/uL — ABNORMAL HIGH (ref 4.0–10.5)
nRBC: 0 % (ref 0.0–0.2)

## 2021-01-07 MED ORDER — OXYCODONE-ACETAMINOPHEN 5-325 MG PO TABS: 2.0000 | ORAL_TABLET | ORAL | Status: DC | PRN

## 2021-01-07 MED ORDER — DIPHENHYDRAMINE HCL 50 MG/ML IJ SOLN
12.5000 mg | INTRAMUSCULAR | Status: DC | PRN
Start: 2021-01-07 — End: 2021-01-10

## 2021-01-07 MED ORDER — OXYTOCIN-SODIUM CHLORIDE 30-0.9 UT/500ML-% IV SOLN
2.5000 [IU]/h | INTRAVENOUS | Status: DC
  Filled 2021-01-07: qty 500

## 2021-01-07 MED ORDER — PHENYLEPHRINE 40 MCG/ML (10ML) SYRINGE FOR IV PUSH (FOR BLOOD PRESSURE SUPPORT): 80.0000 ug | PREFILLED_SYRINGE | INTRAVENOUS | Status: DC | PRN

## 2021-01-07 MED ORDER — OXYTOCIN BOLUS FROM INFUSION
333.0000 mL | Freq: Once | INTRAVENOUS | Status: DC
Start: 2021-01-07 — End: 2021-01-10

## 2021-01-07 MED ORDER — SOD CITRATE-CITRIC ACID 500-334 MG/5ML PO SOLN: 30.0000 mL | ORAL | Status: DC | PRN

## 2021-01-07 MED ORDER — ZOLPIDEM TARTRATE 5 MG PO TABS: 5.0000 mg | ORAL_TABLET | Freq: Every evening | ORAL | Status: DC | PRN

## 2021-01-07 MED ORDER — BUTORPHANOL TARTRATE 1 MG/ML IJ SOLN: 1.0000 mg | INTRAMUSCULAR | Status: DC | PRN

## 2021-01-07 MED ORDER — FENTANYL-BUPIVACAINE-NACL 0.5-0.125-0.9 MG/250ML-% EP SOLN
12.0000 mL/h | EPIDURAL | Status: DC | PRN
  Administered 2021-01-08 – 2021-01-09 (×3): 12 mL/h via EPIDURAL
  Filled 2021-01-07 (×3): qty 250

## 2021-01-07 MED ORDER — LACTATED RINGERS IV SOLN
500.0000 mL | INTRAVENOUS | Status: DC | PRN
  Administered 2021-01-08: 1000 mL via INTRAVENOUS
  Administered 2021-01-09: 250 mL via INTRAVENOUS

## 2021-01-07 MED ORDER — ONDANSETRON HCL 4 MG/2ML IJ SOLN
4.0000 mg | Freq: Four times a day (QID) | INTRAMUSCULAR | Status: DC | PRN
  Administered 2021-01-09 (×2): 4 mg via INTRAVENOUS
  Filled 2021-01-07 (×2): qty 2

## 2021-01-07 MED ORDER — EPHEDRINE 5 MG/ML INJ: 10.0000 mg | INTRAVENOUS | Status: DC | PRN

## 2021-01-07 MED ORDER — ACETAMINOPHEN 325 MG PO TABS
650.0000 mg | ORAL_TABLET | ORAL | Status: DC | PRN
Start: 2021-01-07 — End: 2021-01-10
  Administered 2021-01-07 – 2021-01-08 (×2): 650 mg via ORAL
  Filled 2021-01-07 (×2): qty 2

## 2021-01-07 MED ORDER — LIDOCAINE HCL (PF) 1 % IJ SOLN
30.0000 mL | INTRAMUSCULAR | Status: DC | PRN
Start: 2021-01-07 — End: 2021-01-10

## 2021-01-07 MED ORDER — OXYCODONE-ACETAMINOPHEN 5-325 MG PO TABS: 1.0000 | ORAL_TABLET | ORAL | Status: DC | PRN

## 2021-01-07 MED ORDER — LACTATED RINGERS IV SOLN
500.0000 mL | Freq: Once | INTRAVENOUS | Status: AC
  Administered 2021-01-08: 500 mL via INTRAVENOUS

## 2021-01-07 MED ORDER — MISOPROSTOL 200 MCG PO TABS
200.0000 ug | ORAL_TABLET | ORAL | Status: AC
  Administered 2021-01-07 – 2021-01-08 (×3): 200 ug via VAGINAL
  Filled 2021-01-07 (×3): qty 1

## 2021-01-07 MED ORDER — LACTATED RINGERS IV SOLN
INTRAVENOUS | Status: DC
  Administered 2021-01-08: 1000 mL via INTRAVENOUS
  Administered 2021-01-08: 950 mL via INTRAVENOUS

## 2021-01-07 NOTE — MAU Provider Note (Signed)
History     CSN: [id number]  Arrival date and time: 01/07/21 1740   Event Date/Time   First Provider Initiated Contact with Patient 01/07/21 1830      Chief Complaint  Patient presents with   Decreased Fetal Movement   HPI This is a 30 yo G2P1001 at [redacted]w[redacted]d. She was seen in the office earlier today after having decreased movement yesterday. The [place] in the office showed an IUFD. She came to the MAU for confirmation. No cramping, leaking fluid, contractions, bleeding.  OB History     Gravida  1   Para      Term      Preterm      AB      Living         SAB      IAB      Ectopic      Multiple      Live Births              Past Medical History:  Diagnosis Date   Anxiety     No past surgical history on file.  No family history on file.  Social History   Tobacco Use   Smoking status: Former    Types: Cigarettes    Quit date: 2015    Years since quitting: 7.7   Smokeless tobacco: Never  Substance Use Topics   Alcohol use: No   Drug use: No    Allergies: No Known Allergies  Medications Prior to Admission  Medication Sig Dispense Refill Last Dose   aspirin EC 325 MG tablet Take 650 mg by mouth 2 (two) times daily as needed for mild pain.      cyclobenzaprine (FLEXERIL) 10 MG tablet Take 1 tablet (10 mg total) by mouth 2 (two) times daily as needed for muscle spasms. 20 tablet 0    Multiple Vitamins-Minerals (HAIR SKIN AND NAILS FORMULA PO) Take 2 tablets by mouth daily.      traMADol (ULTRAM) 50 MG tablet Take 1 tablet (50 mg total) by mouth every 6 (six) hours as needed. 15 tablet 0     Review of Systems Physical Exam   Blood pressure 116/63, pulse 81, temperature 97.9 F (36.6 C), temperature source Oral, resp. rate 20, height 5\' 6"  (1.676 m), weight 83.1 kg, SpO2 99 %.  Physical Exam Vitals reviewed.  Constitutional:      Appearance: Normal appearance.  Abdominal:     General: Abdomen is flat. There is no distension.     Palpations:  Abdomen is soft. There is no mass.     Tenderness: There is no abdominal tenderness. There is no guarding.  Skin:    General: Skin is warm and dry.     Capillary Refill: Capillary refill takes less than 2 seconds.  Neurological:     General: No focal deficit present.     Mental Status: She is alert.  Psychiatric:        Mood and Affect: Mood normal.        Behavior: Behavior normal.        Thought Content: Thought content normal.        Judgment: Judgment normal.     MAU Course  Procedures Pt informed that the ultrasound is considered a limited OB ultrasound and is not intended to be a complete ultrasound exam.  Patient also informed that the ultrasound is not being completed with the intent of assessing for fetal or placental anomalies or any pelvic abnormalities.  Explained that the purpose of today's ultrasound is to assess for  viability.  Patient acknowledges the purpose of the exam and the limitations of the study.    Baby in vertex position, no cardiac activity seen.   MDM I discussed [place] with patient and her partner regarding the [place]. I stressed that this is not something that could be changed at this point. I discussed options withthem - induction tonight vs waiting a couple of days to process. After giving them some time to talk, they opted for induction tonight.  Assessment and Plan   1. [redacted] weeks gestation of pregnancy   2. IUFD at 20 weeks or more of gestation    Discussed with Dr Senaida Ores - Admit.  Levie Heritage 01/07/2021, 6:30 PM

## 2021-01-07 NOTE — H&P (Signed)
Ellen Hodge is a 30 y.o. female G2P1001 at 70 1/7 weeks (EDD 04/14/21 by 7 week [place]) presenting with unfortunate finding of IUFD when presented to office with decreased fetal movement. Prenatal care had been totally uneventful.  NIPT low risk, female   Past OB Hx 09-24-2007 F, 7lbs 2oz, Vaginal Delivery  Past Medical History:  Diagnosis Date   Anxiety    No past surgical history on file. Family History: family history is not on file. Social History:  reports that she quit smoking about 7 years ago. She has never used smokeless tobacco. She reports that she does not drink alcohol and does not use drugs.     Maternal Diabetes: not yet tested Genetic Screening: Normal Maternal Ultrasounds/Referrals: Normal Fetal Ultrasounds or other Referrals:  None Maternal Substance Abuse:  No Significant Maternal Medications:  None Significant Maternal Lab Results:  None Other Comments:  None  Review of Systems  Constitutional:  Negative for fever.  Gastrointestinal:  Negative for abdominal pain.  Genitourinary:  Negative for vaginal bleeding and vaginal pain.  History   Blood pressure 116/63, pulse 81, temperature 97.9 F (36.6 C), temperature source Oral, resp. rate 20, height 5\' 6"  (1.676 m), weight 83.1 kg, SpO2 99 %. Maternal Exam:  Uterine Assessment: Contraction frequency is rare.  Abdomen: Patient reports no abdominal tenderness. Fetal presentation: vertex Introitus: Normal vulva. Normal vagina.  Pelvis: adequate for delivery.    Physical Exam Constitutional:      Appearance: Normal appearance.  Cardiovascular:     Rate and Rhythm: Normal rate and regular rhythm.  Pulmonary:     Effort: Pulmonary effort is normal.  Abdominal:     Palpations: Abdomen is soft.  Genitourinary:    General: Normal vulva.  Neurological:     Mental Status: She is alert.  Psychiatric:     Comments: Appropriately sad    Prenatal labs: ABO, Rh:  O positive Antibody:  negative Rubella:   Immune RPR:   NR HBsAg:   Neg HIV:   NR NIPT low risk, female AFP negative  Assessment/Plan: Pt with IUFD of unknown etiology confirmed with in MAU.  Pt and partner desire to be admitted and begin induction process.  Pt got to room and partner had to leave to go home for awhile so requested I wait for him to return before placing cytotec.    [place] 01/07/2021, 6:47 PM

## 2021-01-07 NOTE — MAU Note (Signed)
Pt sent from MD office for confirmation ultrasound.  Pt had U/S in office today, IUFD noted.  Pt requesting a second opinion.

## 2021-01-07 NOTE — Progress Notes (Signed)
Patient ID: Ellen Hodge, female   DOB: 06-Jul-1990, 30 y.o.   MRN: [id number] Pt partner returned and answered all questions and reviewed plan with him and patient  Bedside US done confirms oblique frank breech presentation with head to maternal right.    Cervix 50/closed/-3  Cytotec placed in vagina at 2233  D/w pt epidural and pain control.  She will get epidural when starts to get uncomfortable.  We discussed that they can elect autopsy if no obvious cause of demise is noted at delivery.  I also d/w pt and partner that baby is oblique/breech and is possible body will deliver before head and the possibility of retained placenta.

## 2021-01-08 ENCOUNTER — Inpatient Hospital Stay (HOSPITAL_COMMUNITY): Payer: BC Managed Care – PPO | Admitting: Anesthesiology

## 2021-01-08 LAB — RPR: RPR Ser Ql: NONREACTIVE

## 2021-01-08 MED ORDER — LIDOCAINE HCL (PF) 1 % IJ SOLN
INTRAMUSCULAR | Status: DC | PRN
  Administered 2021-01-08: 8 mL via EPIDURAL

## 2021-01-08 MED ORDER — OXYTOCIN-SODIUM CHLORIDE 30-0.9 UT/500ML-% IV SOLN
1.0000 m[IU]/min | INTRAVENOUS | Status: DC
  Administered 2021-01-08: 2 m[IU]/min via INTRAVENOUS
  Filled 2021-01-08: qty 500

## 2021-01-08 MED ORDER — ALPRAZOLAM 0.5 MG PO TABS
0.5000 mg | ORAL_TABLET | ORAL | Status: DC | PRN
  Administered 2021-01-08 – 2021-01-10 (×4): 0.5 mg via ORAL
  Filled 2021-01-08: qty 2
  Filled 2021-01-08: qty 1
  Filled 2021-01-08: qty 2

## 2021-01-08 MED ORDER — MISOPROSTOL 200 MCG PO TABS
600.0000 ug | ORAL_TABLET | ORAL | Status: DC
  Administered 2021-01-08 (×2): 600 ug via ORAL
  Filled 2021-01-08 (×2): qty 3

## 2021-01-08 NOTE — Progress Notes (Signed)
D/w pt today:  Changing cytotec to oral, vaginal was not dissolving. Breech presentation and rare risk of cutting cervix to allow head delivery. Pt had normal NIPT and [place]; d/w her possibility of doing chromosomes or autopsy;  d/w pt will tell her all my findings when fetus delivered to help her with her decisions.  D/w her rare risk of needing operation for D&C if placenta is retained.    Pt comfortable with epidural.  Will start pit when cervix open a little more.  Since baby is breech, would hold off ROM if can to not only decrease infection but increase the likelyhood the entire pregnancy will be delivered together.

## 2021-01-08 NOTE — Anesthesia Procedure Notes (Signed)
Epidural Patient location during procedure: OB Start time: 01/08/2021 2:50 AM End time: 01/08/2021 2:55 AM  Staffing Anesthesiologist: Mellody Dance, MD Performed: anesthesiologist   Preanesthetic Checklist Completed: patient identified, IV checked, site marked, risks and benefits discussed, monitors and equipment checked, pre-op evaluation and timeout performed  Epidural Patient position: sitting Prep: DuraPrep Patient monitoring: heart rate, cardiac monitor, continuous pulse ox and blood pressure Approach: midline Location: L3-L4 Injection technique: LOR saline  Needle:  Needle type: Tuohy  Needle gauge: 17 G Needle length: 9 cm Needle insertion depth: 7 cm Catheter type: closed end flexible Catheter size: 20 Guage Catheter at skin depth: 12 cm Test dose: negative and Other  Assessment Events: blood not aspirated, injection not painful, no injection resistance and negative IV test  Additional Notes Informed consent obtained prior to proceeding including risk of failure, 1% risk of PDPH, risk of minor discomfort and bruising.  Discussed rare but serious complications including epidural abscess, permanent nerve injury, epidural hematoma.  Discussed alternatives to epidural analgesia and patient desires to proceed.  Timeout performed pre-procedure verifying patient name, procedure, and platelet count.  Patient tolerated procedure well.

## 2021-01-08 NOTE — Progress Notes (Signed)
Patient ID: Ellen Hodge, female   DOB: 06/28/1990, 30 y.o.   MRN: [id number] Pt feeling anxious and unable to sleep.  Received epidural and more comfortable  Afeb VSS  Cytotec placed Cervix 50/cl/-2  D/w pt her normal anxiety in a tough situation and will order xanax to help her rest

## 2021-01-08 NOTE — Anesthesia Preprocedure Evaluation (Signed)
Anesthesia Evaluation  Patient identified by MRN, date of birth, ID band Patient awake    Reviewed: Allergy & Precautions, NPO status , Patient's Chart, lab work & pertinent test results  Airway Mallampati: II  TM Distance: >3 FB Neck ROM: Full    Dental no notable dental hx.    Pulmonary neg pulmonary ROS, former smoker,    Pulmonary exam normal breath sounds clear to auscultation       Cardiovascular negative cardio ROS Normal cardiovascular exam Rhythm:Regular Rate:Normal     Neuro/Psych PSYCHIATRIC DISORDERS Anxiety negative neurological ROS     GI/Hepatic negative GI ROS, Neg liver ROS,   Endo/Other  negative endocrine ROS  Renal/GU negative Renal ROS  negative genitourinary   Musculoskeletal negative musculoskeletal ROS (+)   Abdominal   Peds negative pediatric ROS (+)  Hematology  (+) anemia ,   Anesthesia Other Findings   Reproductive/Obstetrics (+) Pregnancy IUFD at 26 weeks                             Anesthesia Physical Anesthesia Plan  ASA: 2  Anesthesia Plan: Epidural   Post-op Pain Management:    Induction:   PONV Risk Score and Plan: 2 and Treatment may vary due to age or medical condition  Airway Management Planned: Natural Airway  Additional Equipment: None  Intra-op Plan:   Post-operative Plan:   Informed Consent: I have reviewed the patients History and Physical, chart, labs and discussed the procedure including the risks, benefits and alternatives for the proposed anesthesia with the patient or authorized representative who has indicated his/her understanding and acceptance.       Plan Discussed with: Anesthesiologist  Anesthesia Plan Comments:         Anesthesia Quick Evaluation

## 2021-01-08 NOTE — Progress Notes (Signed)
Chaplain notified by hospital [facility], Leotis Shames, that pt presented to her OB's office yesterday with an IUFD. Chaplain met with pt's RNs and received update on pt history and progress. At this time pt does not want a visit from spiritual care.  Please page as further needs arise.  Donald Prose. Elyn Peers, M.Div. [facility] Chaplain Pager [phone] Office [phone]

## 2021-01-09 ENCOUNTER — Encounter (HOSPITAL_COMMUNITY): Payer: Self-pay | Admitting: Obstetrics and Gynecology

## 2021-01-09 MED ORDER — ONDANSETRON HCL 4 MG/2ML IJ SOLN: 4.0000 mg | INTRAMUSCULAR | Status: DC | PRN

## 2021-01-09 MED ORDER — BENZOCAINE-MENTHOL 20-0.5 % EX AERO: 1.0000 "application " | INHALATION_SPRAY | CUTANEOUS | Status: DC | PRN

## 2021-01-09 MED ORDER — IBUPROFEN 600 MG PO TABS
600.0000 mg | ORAL_TABLET | Freq: Four times a day (QID) | ORAL | Status: DC
  Administered 2021-01-10 – 2021-01-11 (×6): 600 mg via ORAL
  Filled 2021-01-09 (×6): qty 1

## 2021-01-09 MED ORDER — ONDANSETRON HCL 4 MG PO TABS: 4.0000 mg | ORAL_TABLET | ORAL | Status: DC | PRN

## 2021-01-09 MED ORDER — DIBUCAINE (PERIANAL) 1 % EX OINT: 1.0000 "application " | TOPICAL_OINTMENT | CUTANEOUS | Status: DC | PRN

## 2021-01-09 MED ORDER — OXYCODONE HCL 5 MG PO TABS
10.0000 mg | ORAL_TABLET | ORAL | Status: DC | PRN
  Administered 2021-01-10 (×4): 10 mg via ORAL
  Filled 2021-01-09 (×4): qty 2

## 2021-01-09 MED ORDER — WITCH HAZEL-GLYCERIN EX PADS: 1.0000 "application " | MEDICATED_PAD | CUTANEOUS | Status: DC | PRN

## 2021-01-09 MED ORDER — COCONUT OIL OIL: 1.0000 "application " | TOPICAL_OIL | Status: DC | PRN

## 2021-01-09 MED ORDER — OXYCODONE HCL 5 MG PO TABS: 5.0000 mg | ORAL_TABLET | ORAL | Status: DC | PRN

## 2021-01-09 MED ORDER — SIMETHICONE 80 MG PO CHEW
80.0000 mg | CHEWABLE_TABLET | ORAL | Status: DC | PRN
  Filled 2021-01-09: qty 1

## 2021-01-09 MED ORDER — CYCLOBENZAPRINE HCL 10 MG PO TABS
10.0000 mg | ORAL_TABLET | Freq: Two times a day (BID) | ORAL | Status: DC | PRN
  Administered 2021-01-10: 10 mg via ORAL
  Filled 2021-01-09: qty 1

## 2021-01-09 MED ORDER — ACETAMINOPHEN 325 MG PO TABS
650.0000 mg | ORAL_TABLET | ORAL | Status: DC | PRN
  Administered 2021-01-10: 650 mg via ORAL
  Filled 2021-01-09: qty 2

## 2021-01-09 MED ORDER — LACTATED RINGERS IV SOLN: INTRAVENOUS | Status: DC

## 2021-01-09 MED ORDER — PRENATAL MULTIVITAMIN CH
1.0000 | ORAL_TABLET | Freq: Every day | ORAL | Status: DC
  Administered 2021-01-10 – 2021-01-11 (×2): 1 via ORAL
  Filled 2021-01-09 (×2): qty 1

## 2021-01-09 MED ORDER — DIPHENHYDRAMINE HCL 25 MG PO CAPS: 25.0000 mg | ORAL_CAPSULE | Freq: Four times a day (QID) | ORAL | Status: DC | PRN

## 2021-01-09 MED ORDER — ZOLPIDEM TARTRATE 5 MG PO TABS: 5.0000 mg | ORAL_TABLET | Freq: Every evening | ORAL | Status: DC | PRN

## 2021-01-09 MED ORDER — TETANUS-DIPHTH-ACELL PERTUSSIS 5-2.5-18.5 LF-MCG/0.5 IM SUSY: 0.5000 mL | PREFILLED_SYRINGE | Freq: Once | INTRAMUSCULAR | Status: DC

## 2021-01-09 MED ORDER — OXYTOCIN-SODIUM CHLORIDE 30-0.9 UT/500ML-% IV SOLN: 2.5000 [IU]/h | INTRAVENOUS | Status: DC | PRN

## 2021-01-09 MED ORDER — SENNOSIDES-DOCUSATE SODIUM 8.6-50 MG PO TABS
2.0000 | ORAL_TABLET | Freq: Every day | ORAL | Status: DC
  Administered 2021-01-10 – 2021-01-11 (×2): 2 via ORAL
  Filled 2021-01-09 (×2): qty 2

## 2021-01-09 NOTE — Progress Notes (Signed)
Ellen Hodge is a 30 y.o. G2P1001 at [redacted]w[redacted]d admitted for IOl due to fetal demise  Subjective: Pt reports no pain with contractions due to adequate epidural. She denies fever, chills or LOF. No complaints  Objective: BP (!) 103/56   Pulse (!) 56   Temp 97.6 F (36.4 C) (Oral)   Resp 15   Ht 5\' 6"  (1.676 m)   Wt 83.1 kg   SpO2 98%   BMI 29.59 kg/m  I/O last 3 completed shifts: In: -  Out: 3625 [Urine:3625] Total I/O In: -  Out: 1700 [Urine:1700]   UC:   irregular, every 3-5 minutes SVE:   Dilation: 1 Effacement (%): 50 Station: -3 Exam by:: [email or web address], RN - unchanged since last sve 13hrs ago  Labs: Lab Results  Component Value Date   WBC 14.6 (H) 01/07/2021   HGB 11.9 (L) 01/07/2021   HCT 35.8 (L) 01/07/2021   MCV 92.7 01/07/2021   PLT 340 01/07/2021    Assessment / Plan: 30yo G2P1001 female at 61 3/7wks with IUFD -Currently on 30 of pitocin ; continue per protocol -S/P cytotec vaginally x 3 and then  orally x 2 - S/P epidural - Offered to place a cooks foley catheter to effect cervical dilation with mechanical pressure given baby in breech lie. Pt requests to wait till her husband is back at bedside ( went home to let dogs out)  01/09/2021, 9:50 AM

## 2021-01-09 NOTE — Progress Notes (Signed)
Patient ID: Ellen Hodge, female   DOB: [birth year], 30 y.o.   MRN: [id number] FOB returned. Pt ok now to proceed with foley placement  Cervix noted to be 1/70/-2 Pitocin at  Cooks catheter placed with 60u/40v saline  Continue with pitocin per protocol Check foley q 1-2 hrs

## 2021-01-09 NOTE — Progress Notes (Signed)
Patient ID: Ellen Hodge, female   DOB: Dec 31, 1990, 30 y.o.   MRN: [id number] Foley out about ago Cervix now 4/90/-2; no fetal parts palpated  Plan - continue pitocin per protocol ; now at           Defer AROM at this time till further dilated to better effect intact delivery

## 2021-01-10 ENCOUNTER — Encounter (HOSPITAL_COMMUNITY): Payer: Self-pay | Admitting: Obstetrics and Gynecology

## 2021-01-10 LAB — CBC
HCT: 26.5 % — ABNORMAL LOW (ref 36.0–46.0)
Hemoglobin: 8.9 g/dL — ABNORMAL LOW (ref 12.0–15.0)
MCH: 31.4 pg (ref 26.0–34.0)
MCHC: 33.6 g/dL (ref 30.0–36.0)
MCV: 93.6 fL (ref 80.0–100.0)
Platelets: 258 10*3/uL (ref 150–400)
RBC: 2.83 MIL/uL — ABNORMAL LOW (ref 3.87–5.11)
RDW: 12.7 % (ref 11.5–15.5)
WBC: 16.5 10*3/uL — ABNORMAL HIGH (ref 4.0–10.5)
nRBC: 0 % (ref 0.0–0.2)

## 2021-01-10 NOTE — Anesthesia Postprocedure Evaluation (Signed)
Anesthesia Post Note  Patient: [name]  Procedure(s) Performed: AN AD HOC LABOR EPIDURAL     Patient location during evaluation: Mother Baby Anesthesia Type: Epidural Level of consciousness: awake and alert Pain management: pain level controlled Vital Signs Assessment: post-procedure vital signs reviewed and stable Respiratory status: spontaneous breathing, nonlabored ventilation and respiratory function stable Cardiovascular status: stable Postop Assessment: no headache, no backache and epidural receding Anesthetic complications: no   No notable events documented.  Last Vitals:  Vitals:   01/10/21 0419 01/10/21 0739  BP: (!) 99/48 (!) 102/52  Pulse: 91 85  Resp: 18 18  Temp:  36.5 C  SpO2:  99%    Last Pain:  Vitals:   01/10/21 0739  TempSrc: Oral  PainSc:    Pain Goal:                   Crecencio Kwiatek

## 2021-01-10 NOTE — Progress Notes (Signed)
Post Partum Day 1 Subjective: Ellen Hodge is doing okay this morning. Complaining of some right buttock pain, no back or vaginal pain. Ambulating, voiding, tolerating PO. Feeling sad as expected, doesn't want to be separated from baby.   Objective: Patient Vitals for the past 24 hrs:  BP Temp Temp src Pulse Resp SpO2  01/10/21 1152 (!) 103/52 -- -- 82 -- --  01/10/21 1121 (!) 102/39 98.3 F (36.8 C) Oral 75 18 99 %  01/10/21 0739 (!) 102/52 97.7 F (36.5 C) Oral 85 18 99 %  01/10/21 0419 (!) 99/48 -- -- 91 18 --  01/10/21 0410 (!) 80/42 98 F (36.7 C) Oral 83 17 95 %  01/10/21 0125 100/65 98.1 F (36.7 C) Oral 83 18 98 %  01/09/21 2116 (!) 105/59 -- -- 85 -- --  01/09/21 2101 96/62 -- -- 88 -- --  01/09/21 2046 (!) 100/48 -- -- 89 -- --  01/09/21 2045 -- -- -- -- -- 98 %  01/09/21 2040 -- -- -- -- -- 97 %  01/09/21 2035 -- -- -- -- -- 99 %  01/09/21 2031 113/61 -- -- 87 -- --  01/09/21 2030 -- -- -- -- -- 97 %  01/09/21 2025 -- -- -- -- -- 98 %  01/09/21 2020 -- -- -- -- -- 99 %  01/09/21 2016 117/63 -- -- 93 -- --  01/09/21 2015 -- -- -- -- -- 99 %  01/09/21 2010 -- -- -- -- -- 98 %  01/09/21 2005 -- -- -- -- -- 99 %  01/09/21 2001 118/78 -- -- (!) 110 -- --  01/09/21 2000 -- -- -- -- -- 97 %  01/09/21 1955 -- -- -- -- -- 99 %  01/09/21 1950 -- -- -- -- -- 99 %  01/09/21 1946 110/62 -- -- 86 -- --  01/09/21 1945 -- -- -- -- -- 99 %  01/09/21 1940 -- -- -- -- -- 100 %  01/09/21 1935 -- -- -- -- -- 99 %  01/09/21 1931 108/69 -- -- (!) 130 -- --  01/09/21 1930 -- -- -- -- -- 99 %  01/09/21 1925 -- -- -- -- -- 100 %  01/09/21 1920 -- -- -- -- -- 96 %  01/09/21 1917 (!) 84/46 -- -- 90 -- --  01/09/21 1915 -- -- -- -- -- 95 %  01/09/21 1910 -- -- -- -- -- 95 %  01/09/21 1905 -- -- -- -- -- 99 %  01/09/21 1901 (!) 203/176 -- -- (!) 259 -- --  01/09/21 1900 -- -- -- -- -- 99 %  01/09/21 1858 (!) 188/131 -- -- (!) 204 -- --  01/09/21 1855 -- -- -- -- -- 100 %  01/09/21 1850 --  -- -- -- -- 98 %  01/09/21 1845 -- -- -- -- -- 98 %  01/09/21 1801 112/62 -- -- 72 -- --  01/09/21 1731 125/64 -- -- 76 -- --  01/09/21 1701 (!) 107/54 -- -- 75 -- --  01/09/21 1630 107/63 -- -- 73 16 --  01/09/21 1600 121/65 -- -- 77 17 --  01/09/21 1535 -- 97.8 F (36.6 C) Oral -- -- --  01/09/21 1531 132/76 -- -- 83 16 --  01/09/21 1500 118/77 -- -- 75 -- --  01/09/21 1430 108/64 -- -- 78 -- --  01/09/21 1400 105/65 -- -- 78 -- --  01/09/21 1330 111/71 -- -- 69 -- --  01/09/21 1300 110/65 -- -- 77 -- --  01/09/21 1230 124/70 -- -- 67 15 --    Physical Exam:  General: alert, cooperative, and no distress, resting in bed with baby Lochia: appropriate Uterine Fundus: firm DVT Evaluation: No evidence of DVT seen on physical exam. Right mid gluteus mildly tender, no lower back/spinal/coccyx tenderness, no mass  Recent Labs    01/07/21 1921 01/10/21 0412  WBC 14.6* 16.5*  HGB 11.9* 8.9*  HCT 35.8* 26.5*  PLT 340 258    No results for input(s): NA, K, CL, CO2CT, BUN, CREATININE, GLUCOSE, BILITOT, ALT, AST, ALKPHOS, PROT, ALBUMIN in the last 72 hours.  No results for input(s): CALCIUM, MG, PHOS in the last 72 hours.  No results for input(s): PROTIME, APTT, INR in the last 72 hours.  No results for input(s): PROTIME, APTT, INR, FIBRINOGEN in the last 72 hours. Assessment/Plan:  Ellen Hodge 30 y.o. G2P1101 PPD#1 sp SVD, IUFD at 26w 1. PPC: continue routine postpartum care - unclear source of right buttock pain - suspect MSK from labor, not suspicious for epidural complication 2. ABLA: PO iron 3. Rh pos 4. Dispo: long discussion regarding discharge. Would be appropriate for discharge today, she is currently feeling very emotional and not ready to go home. She does not want to be separated from the baby. I discussed with [facility] charge nurse if baby can be discharged with her - she will look into this and let me know.     LOS: 3 days   Charlett Nose 01/10/2021,  12:06 PM

## 2021-01-11 MED ORDER — FERROUS SULFATE 325 (65 FE) MG PO TABS
325.0000 mg | ORAL_TABLET | Freq: Every day | ORAL | Status: DC
  Administered 2021-01-11: 325 mg via ORAL
  Filled 2021-01-11: qty 1

## 2021-01-11 NOTE — Discharge Summary (Signed)
Postpartum Discharge Summary     Patient Name: Ellen Hodge DOB: 03-13-1991 MRN: [id number]  Date of admission: 01/07/2021 Delivery date:01/09/2021  Delivering provider: [facility]  Date of discharge: 01/11/2021  Admitting diagnosis: IUFD at 35 weeks or more of gestation [O36.4XX0] Intrauterine pregnancy: [redacted]w[redacted]d     Secondary diagnosis:  Active Problems:   IUFD at 38 weeks or more of gestation  Additional problems: none    Discharge diagnosis:  preterm delivery of iUFD                                               Post partum procedures: none Augmentation: Cytotec Complications: [facility] course: Induction of Labor With Vaginal Delivery   30 y.o. yo G2P1101 at [redacted]w[redacted]d was admitted to the hospital 01/07/2021 for induction of labor.  Indication for induction:  iufd .  Patient had an uncomplicated labor course as follows: Membrane Rupture Time/Date: 6:08 PM ,01/09/2021   Delivery Method:Vaginal, Breech  Episiotomy: None  Lacerations:  None  Details of delivery can be found in separate delivery note.  Patient had a routine postpartum course. Patient is discharged home 01/11/21.  Newborn Data: Birth date:01/09/2021  Birth time:6:33 PM  Gender:Female  Living status:Fetal Demise  Apgars: ,  Weight:720 g   Magnesium Sulfate received: No BMZ received: No Rhophylac:N/A MMR:N/A T-DaP: see office note Flu: N/A Transfusion:No  Physical exam  Vitals:   01/10/21 2311 01/11/21 0337 01/11/21 0831 01/11/21 1206  BP:  (!) 86/45 (!) 109/51 131/64  Pulse:  92 87 91  Resp:  $Remo'18 18 18  'Zgveu$ Temp:  98.2 F (36.8 C) 98.4 F (36.9 C) 98.3 F (36.8 C)  TempSrc:  Oral Oral Oral  SpO2: 97% 96% 97% 98%  Weight:      Height:       General: alert and cooperative Lochia: appropriate Uterine Fundus: firm Incision: N/A DVT Evaluation: No evidence of DVT seen on physical exam. Labs: Lab Results  Component Value Date   WBC 16.5 (H) 01/10/2021   HGB 8.9 (L)  01/10/2021   HCT 26.5 (L) 01/10/2021   MCV 93.6 01/10/2021   PLT 258 01/10/2021   CMP Latest Ref Rng & Units 10/16/2015  Glucose 65 - 99 mg/dL 95  BUN 6 - 20 mg/dL 10  Creatinine 0.44 - 1.00 mg/dL 0.80  Sodium 135 - 145 mmol/L 139  Potassium 3.5 - 5.1 mmol/L 3.7  Chloride 101 - 111 mmol/L 106  CO2 22 - 32 mmol/L 27  Calcium 8.9 - 10.3 mg/dL 9.6   Edinburgh Score: No flowsheet data found.    After visit meds:  Allergies as of 01/11/2021   No Known Allergies      Medication List     STOP taking these medications    aspirin EC 325 MG tablet   cyclobenzaprine 10 MG tablet Commonly known as: FLEXERIL   traMADol 50 MG tablet Commonly known as: ULTRAM       TAKE these medications    HAIR SKIN AND NAILS FORMULA PO Take 2 tablets by mouth daily.         Discharge home in stable condition Infant Feeding:  IUFD Infant [place] Discharge instruction: per After Visit Summary and Postpartum booklet. Activity: Advance as tolerated. Pelvic rest for 6 weeks.  Diet: routine diet Anticipated Birth Control: Unsure Postpartum Appointment: advised call  for appt Additional Postpartum F/U: Postpartum Depression checkup Future Appointments:No future appointments. Follow up Visit:  Follow-up Information     Ob/Gyn, [facility] Follow up.   Contact information: [place] [address] [phone]                     01/11/2021 Allyn Kenner, DO

## 2021-01-11 NOTE — Progress Notes (Signed)
Pt discharged after discharge instructions given. All questions answered and pt verbalized understanding. Pt sent home with all belongings and pt asked to take her baby to the funeral home herself. Pt has necessary paperwork with her. Pt discharged via wheelchair with baby in her arms.

## 2021-01-13 LAB — SURGICAL PATHOLOGY

## 2021-01-21 ENCOUNTER — Telehealth (HOSPITAL_COMMUNITY): Payer: Self-pay | Admitting: *Deleted

## 2021-01-21 NOTE — Telephone Encounter (Signed)
Patient experienced IUFD. Voiced no questions or concerns regarding her own health at this time. Declined EPDS, stating, "I'm doing OK. I have my church and my family." RN offered to email list of maternal mental health resources and/or information about hospital's virtual bereavement support group - patient declined at this time. Deforest Hoyles, RN, 01/21/21, 2001

## 2021-05-03 ENCOUNTER — Ambulatory Visit
Admission: EM | Admit: 2021-05-03 | Discharge: 2021-05-03 | Disposition: A | Discharge status: Home / Self Care | Payer: BC Managed Care – PPO | Attending: Physician Assistant | Admitting: Physician Assistant

## 2021-05-03 ENCOUNTER — Other Ambulatory Visit: Payer: Self-pay

## 2021-05-03 DIAGNOSIS — L089 Local infection of the skin and subcutaneous tissue, unspecified: Secondary | ICD-10-CM

## 2021-05-03 MED ORDER — DOXYCYCLINE HYCLATE 100 MG PO CAPS: 100.0000 mg | ORAL_CAPSULE | Freq: Two times a day (BID) | ORAL | 0 refills | Status: DC

## 2021-05-03 NOTE — ED Provider Notes (Signed)
[facility]    CSN: [id number] Arrival date & time: 05/03/21  0836      History   Chief Complaint Chief Complaint  Patient presents with   skin infected    Right arm    HPI Ellen Hodge is a 31 y.o. female.   Patient here today for evaluation of possible tattoo infection. She reports that she had tattoo completed at familiar tattoo shop about 4 days ago. 2 days ago she started to notice more pain (burning), itching and drainage from the tattoo. She has tried washing with soap and water and using topical treatment without significant improvement. She has not had fever.   The history is provided by the patient.   Past Medical History:  Diagnosis Date   Anxiety     Patient Active Problem List   Diagnosis Date Noted   IUFD at 38 weeks or more of gestation 01/07/2021    Past Surgical History:  Procedure Laterality Date   BREAST ENHANCEMENT SURGERY Bilateral 2019    OB History     Gravida  2   Para  2   Term  1   Preterm  1   AB      Living  1      SAB      IAB      Ectopic      Multiple  0   Live Births  1            Home Medications    Prior to Admission medications   Medication Sig Start Date End Date Taking? Authorizing Provider  doxycycline (VIBRAMYCIN) 100 MG capsule Take 1 capsule (100 mg total) by mouth 2 (two) times daily. 05/03/21  Yes Francene Finders, PA-C  Multiple Vitamins-Minerals (HAIR SKIN AND NAILS FORMULA PO) Take 2 tablets by mouth daily.    [provider]    Family History Family History  Problem Relation Age of Onset   Hypertension Father     Social History Social History   Tobacco Use   Smoking status: Former    Types: Cigarettes    Quit date: 2015    Years since quitting: 8.0   Smokeless tobacco: Never  Substance Use Topics   Alcohol use: No   Drug use: No     Allergies   Patient has no known allergies.   Review of Systems Review of Systems  Constitutional:  Negative for  chills and fever.  Eyes:  Negative for discharge and redness.  Respiratory:  Negative for shortness of breath.   Gastrointestinal:  Negative for abdominal pain, nausea and vomiting.  Genitourinary:  Positive for vaginal bleeding and vaginal discharge.  Skin:  Positive for color change. Negative for wound.    Physical Exam Triage Vital Signs ED Triage Vitals  Enc Vitals Group     BP 05/03/21 0915 130/84     Pulse Rate 05/03/21 0915 92     Resp 05/03/21 0915 18     Temp 05/03/21 0915 98.6 F (37 C)     Temp Source 05/03/21 0915 Oral     SpO2 05/03/21 0915 99 %     Weight --      Height --      Head Circumference --      Peak Flow --      Pain Score 05/03/21 0919 8     Pain Loc --      Pain Edu? --      Excl. in [place]? --  No data found.  Updated Vital Signs BP 130/84 (BP Location: Left Arm)    Pulse 92    Temp 98.6 F (37 C) (Oral)    Resp 18    SpO2 99%   Physical Exam Vitals and nursing note reviewed.  Constitutional:      General: She is not in acute distress.    Appearance: Normal appearance. She is not ill-appearing.  HENT:     Head: Normocephalic and atraumatic.  Eyes:     Conjunctiva/sclera: Conjunctivae normal.  Cardiovascular:     Rate and Rhythm: Normal rate.  Pulmonary:     Effort: Pulmonary effort is normal.  Skin:    Comments: Diffuse erythema to area of tattoo with scattered pustules noted  Neurological:     Mental Status: She is alert.  Psychiatric:        Mood and Affect: Mood normal.        Behavior: Behavior normal.        Thought Content: Thought content normal.     UC Treatments / Results  Labs (all labs ordered are listed, but only abnormal results are displayed) Labs Reviewed - No data to display  EKG   Radiology No results found.  Procedures Procedures (including critical care time)  Medications Ordered in UC Medications - No data to display  Initial Impression / Assessment and Plan / UC Course  I have reviewed the triage  vital signs and the nursing notes.  Pertinent labs & imaging results that were available during my care of the patient were reviewed by me and considered in my medical decision making (see chart for details).    Will treat with doxycycline and encouraged topical antibiotic ointment. Recommended follow up if no improvement over the next 48 hours, sooner with worsening or with any further concerns.   Final Clinical Impressions(s) / UC Diagnoses   Final diagnoses:  Skin infection   Discharge Instructions   None    ED Prescriptions     Medication Sig Dispense Auth. Provider   doxycycline (VIBRAMYCIN) 100 MG capsule Take 1 capsule (100 mg total) by mouth 2 (two) times daily. 20 capsule Francene Finders, PA-C      PDMP not reviewed this encounter.   Francene Finders, PA-C 05/03/21 1012

## 2021-05-03 NOTE — ED Triage Notes (Signed)
4 days ago, Pt got a tattoo on her right upper arm. She reports an onset 2 days ago of her the skin on her right upper arm becoming itchy. Pt  also describes her skin as bumpy, burning, red and with pus accumulating and sxs worsening since the onset.Marland Kitchen   Has been cleaning the area with mild soap and ointment. Has also been wrapping the area w/ plastic wrap. Pt is familiar with the tattoo shop.

## 2022-02-18 ENCOUNTER — Ambulatory Visit: Admit: 2022-02-18 | Discharge status: Absent without leave | Payer: BC Managed Care – PPO

## 2022-02-18 ENCOUNTER — Ambulatory Visit (INDEPENDENT_AMBULATORY_CARE_PROVIDER_SITE_OTHER): Payer: BC Managed Care – PPO

## 2022-02-18 ENCOUNTER — Ambulatory Visit
Admission: EM | Admit: 2022-02-18 | Discharge: 2022-02-18 | Disposition: A | Discharge status: Home / Self Care | Payer: BC Managed Care – PPO | Attending: Urgent Care | Admitting: Urgent Care

## 2022-02-18 DIAGNOSIS — M4186 Other forms of scoliosis, lumbar region: Secondary | ICD-10-CM

## 2022-02-18 DIAGNOSIS — M545 Low back pain, unspecified: Secondary | ICD-10-CM

## 2022-02-18 DIAGNOSIS — M5442 Lumbago with sciatica, left side: Secondary | ICD-10-CM

## 2022-02-18 MED ORDER — PREDNISONE 20 MG PO TABS: ORAL_TABLET | ORAL | 0 refills | Status: AC

## 2022-02-18 MED ORDER — TIZANIDINE HCL 4 MG PO TABS: 4.0000 mg | ORAL_TABLET | Freq: Every day | ORAL | 0 refills | Status: AC
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
## 2022-05-12 ENCOUNTER — Other Ambulatory Visit (HOSPITAL_COMMUNITY): Payer: Self-pay

## 2022-05-12 MED ORDER — CHORIOGONADOTROPIN ALFA 250 MCG/0.5ML ~~LOC~~ INJ
250.0000 ug | INJECTION | SUBCUTANEOUS | 0 refills | Status: DC
  Filled 2022-05-12: qty 0.5, 1d supply, fill #0

## 2022-06-12 ENCOUNTER — Other Ambulatory Visit (HOSPITAL_COMMUNITY): Payer: Self-pay

## 2022-06-12 MED ORDER — CHORIOGONADOTROPIN ALFA 250 MCG/0.5ML ~~LOC~~ INJ
250.0000 ug | INJECTION | Freq: Once | SUBCUTANEOUS | 0 refills | Status: AC
  Filled 2022-06-12: qty 0.5, 1d supply, fill #0

## 2022-07-11 ENCOUNTER — Other Ambulatory Visit (HOSPITAL_COMMUNITY): Payer: Self-pay

## 2022-07-11 MED ORDER — CHORIOGONADOTROPIN ALFA 250 MCG/0.5ML ~~LOC~~ INJ
INJECTION | SUBCUTANEOUS | 0 refills | Status: DC
  Filled 2022-07-11: qty 0.5, 1d supply, fill #0

## 2022-08-11 ENCOUNTER — Other Ambulatory Visit (HOSPITAL_COMMUNITY): Payer: Self-pay

## 2022-08-11 MED ORDER — OVIDREL 250 MCG/0.5ML ~~LOC~~ INJ
250.0000 ug | INJECTION | Freq: Once | SUBCUTANEOUS | 0 refills | Status: AC
  Filled 2022-08-11: qty 0.5, 1d supply, fill #0
